# Patient Record
Sex: Male | Born: 1985 | Race: White | Hispanic: No | Marital: Single
Health system: Southern US, Community
[De-identification: ages and names within clinical notes are randomized; demographics above are authoritative.]

---

## 2007-08-09 ENCOUNTER — Emergency Department (HOSPITAL_COMMUNITY): Admission: EM | Admit: 2007-08-09 | Discharge: 2007-08-09 | Payer: Self-pay | Admitting: Emergency Medicine

## 2008-11-01 IMAGING — CR DG SHOULDER 2+V*R*
3 series · 3 of 3 positions shown · non-contrast
Comparison: none

CLINICAL DATA: Fall

RIGHT SHOULDER - 3  VIEW:

[view not recorded (1 of 3)]
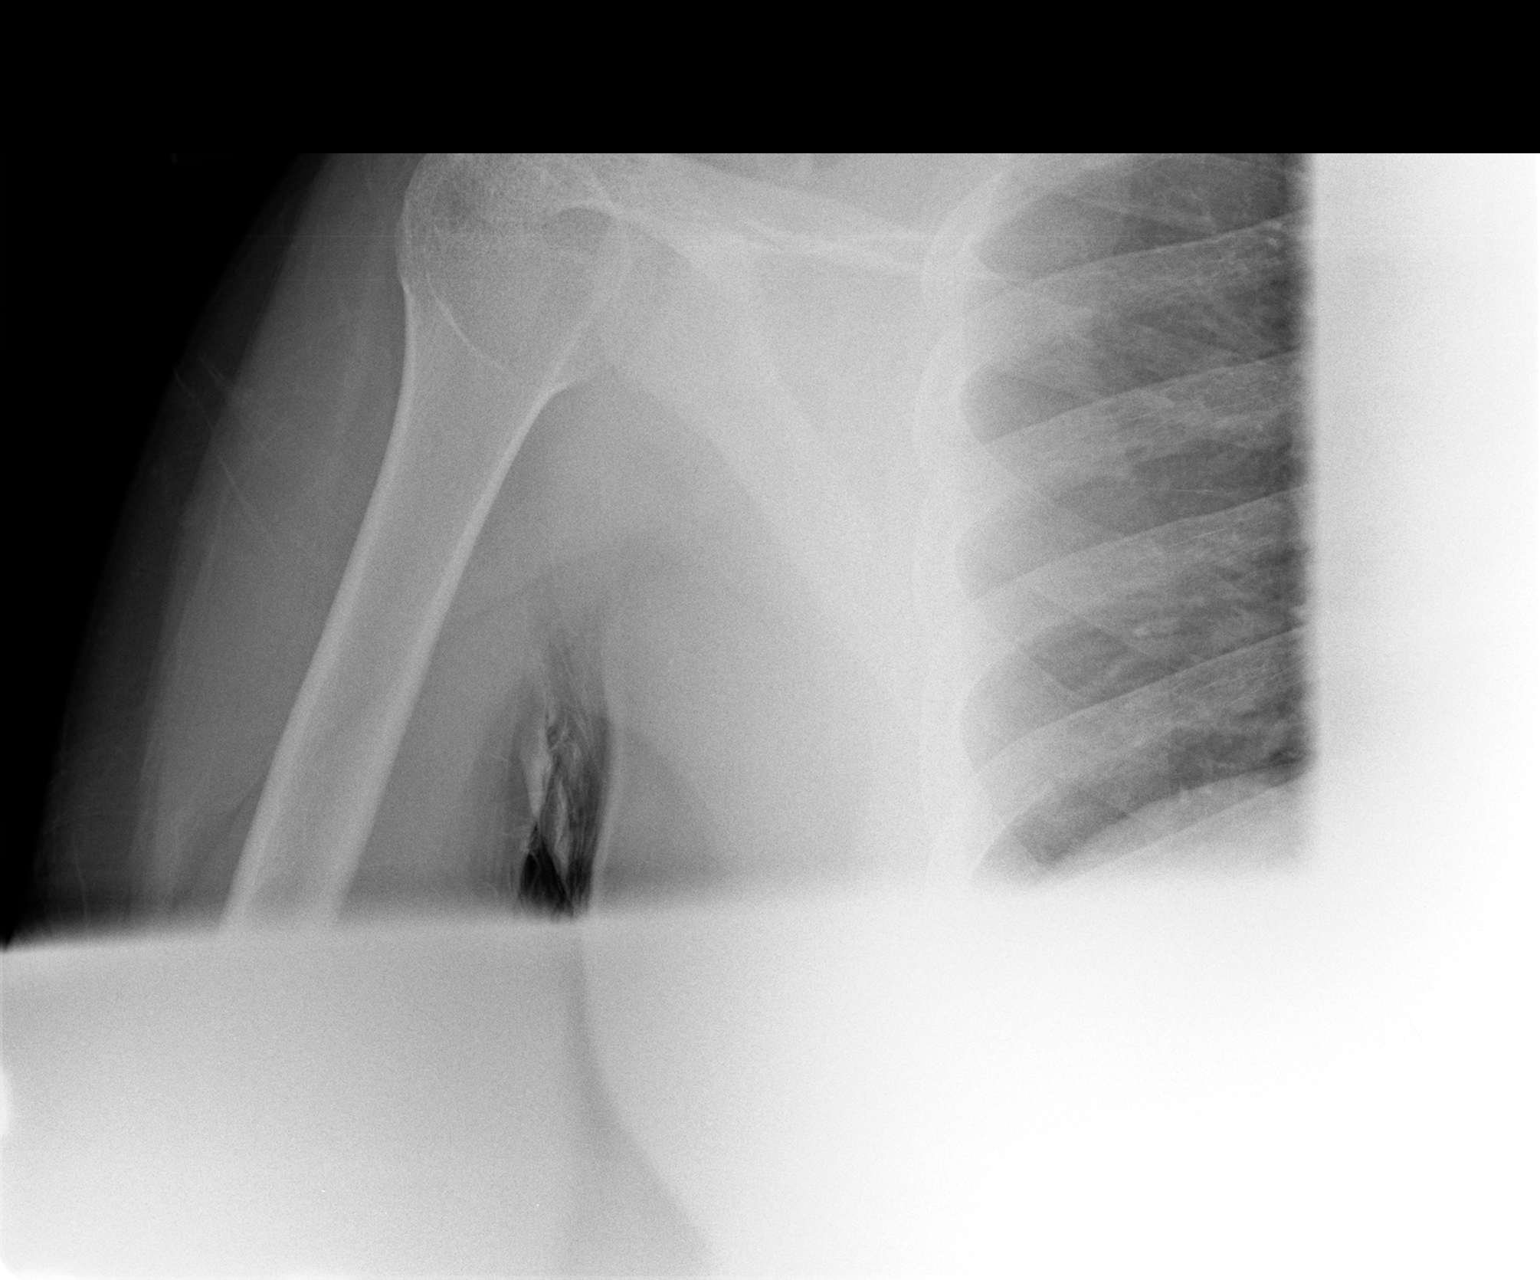

[view not recorded (2 of 3)]
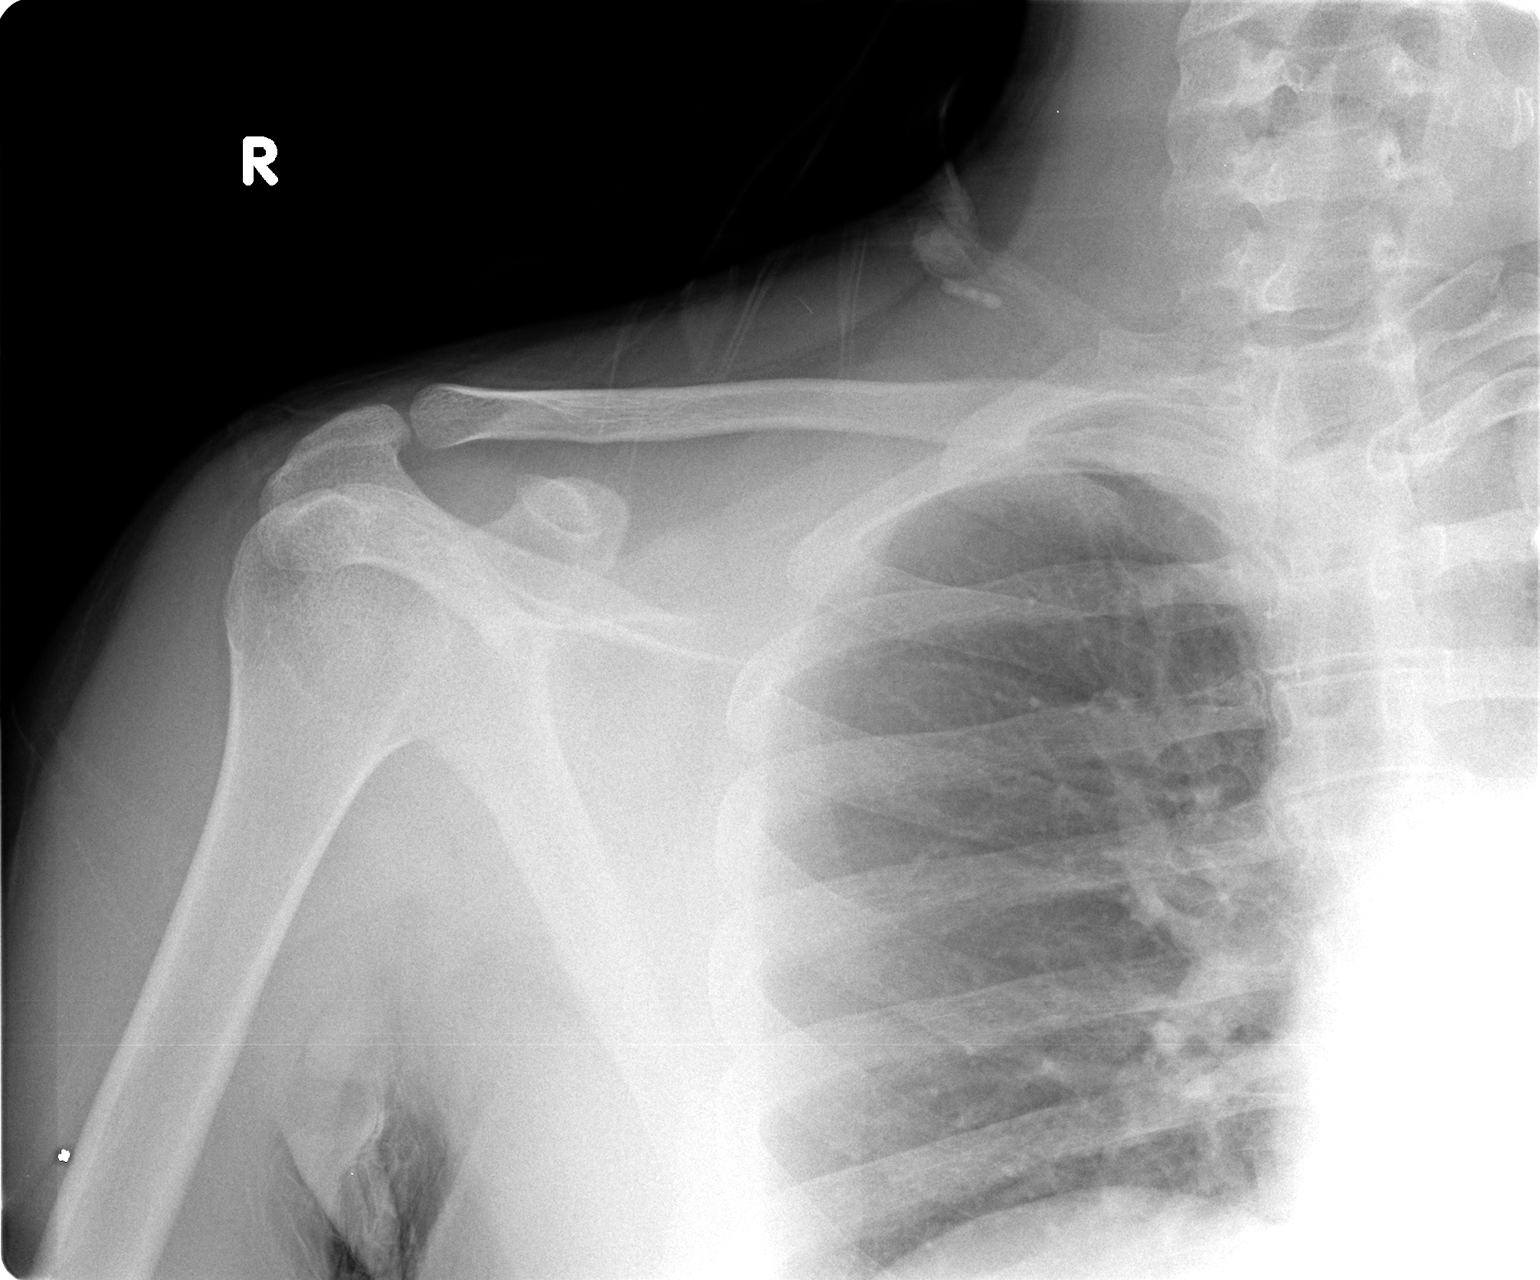

[view not recorded (3 of 3)]
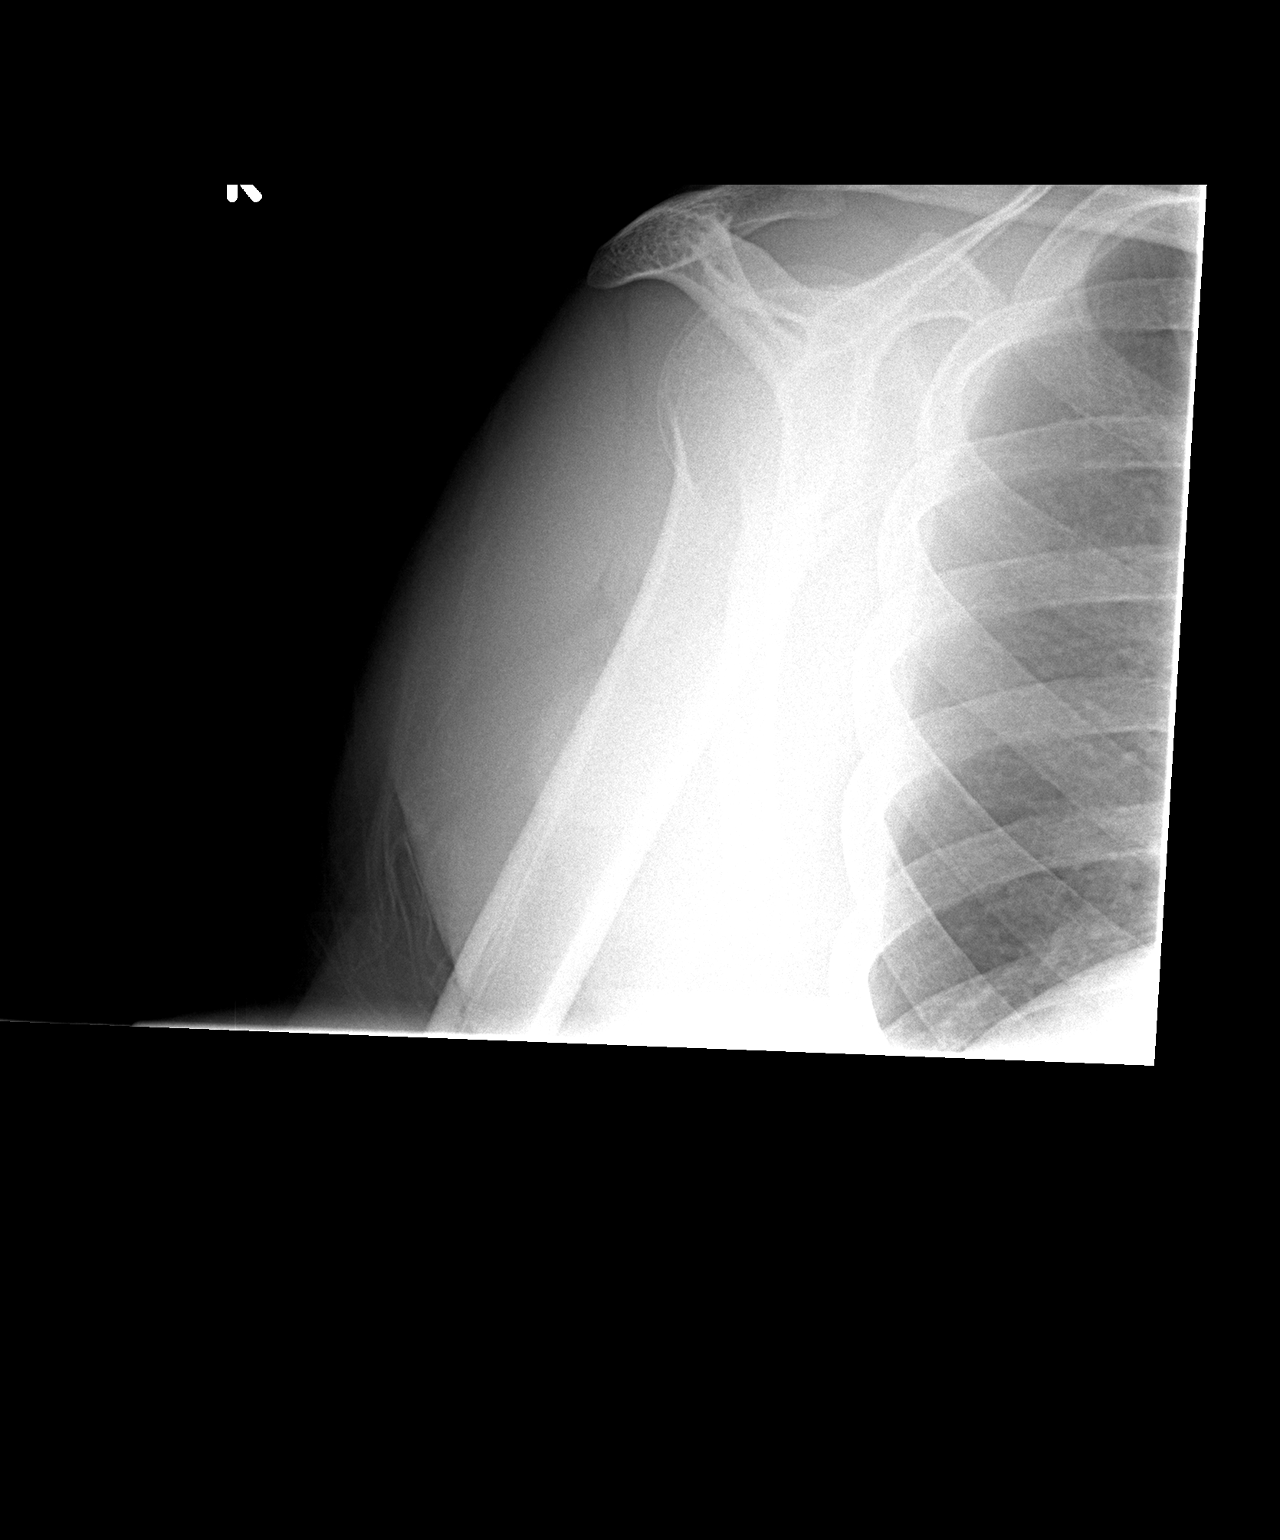

[3 of 3 positions shown; findings below may reference images not displayed]

FINDINGS: There is no evidence of fracture or dislocation.  There is no
evidence of arthropathy or other focal bone abnormality.  Soft tissues are
unremarkable.
IMPRESSION: Negative.

## 2016-12-21 ENCOUNTER — Telehealth: Payer: Self-pay | Admitting: *Deleted

## 2016-12-21 NOTE — Telephone Encounter (Signed)
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
